# Patient Record
Sex: Female | Born: 1975 | Race: White | Hispanic: No | Marital: Single | Smoking: Former smoker
Health system: Southern US, Community
[De-identification: ages and names within clinical notes are randomized; demographics above are authoritative.]

## PROBLEM LIST (undated history)

## (undated) DIAGNOSIS — Z87442 Personal history of urinary calculi: Secondary | ICD-10-CM

## (undated) DIAGNOSIS — Z87898 Personal history of other specified conditions: Secondary | ICD-10-CM

## (undated) DIAGNOSIS — Z9889 Other specified postprocedural states: Secondary | ICD-10-CM

## (undated) DIAGNOSIS — F419 Anxiety disorder, unspecified: Secondary | ICD-10-CM

## (undated) DIAGNOSIS — I10 Essential (primary) hypertension: Secondary | ICD-10-CM

## (undated) DIAGNOSIS — M792 Neuralgia and neuritis, unspecified: Secondary | ICD-10-CM

## (undated) DIAGNOSIS — T8859XA Other complications of anesthesia, initial encounter: Secondary | ICD-10-CM

## (undated) DIAGNOSIS — R112 Nausea with vomiting, unspecified: Secondary | ICD-10-CM

## (undated) DIAGNOSIS — T4145XA Adverse effect of unspecified anesthetic, initial encounter: Secondary | ICD-10-CM

## (undated) DIAGNOSIS — R82992 Hyperoxaluria: Secondary | ICD-10-CM

## (undated) HISTORY — PX: LITHOTRIPSY: SUR834

## (undated) HISTORY — PX: NEPHRECTOMY TRANSPLANTED ORGAN: SUR880

## (undated) HISTORY — PX: AV FISTULA PLACEMENT: SHX1204

## (undated) HISTORY — PX: LIVER TRANSPLANT: SHX410

## (undated) HISTORY — PX: SUTURE REMOVAL: SHX1060

---

## 2014-06-02 HISTORY — PX: LIVER TRANSPLANT: SHX410

## 2014-06-03 HISTORY — PX: KIDNEY TRANSPLANT: SHX239

## 2014-06-18 HISTORY — PX: OTHER SURGICAL HISTORY: SHX169

## 2015-02-08 ENCOUNTER — Emergency Department (HOSPITAL_COMMUNITY)
Admission: EM | Admit: 2015-02-08 | Discharge: 2015-02-09 | Disposition: A | Discharge status: Home / Self Care | Payer: Medicare Other | Attending: Emergency Medicine | Admitting: Emergency Medicine

## 2015-02-08 ENCOUNTER — Encounter (HOSPITAL_COMMUNITY): Payer: Self-pay | Admitting: Emergency Medicine

## 2015-02-08 ENCOUNTER — Emergency Department (HOSPITAL_COMMUNITY): Payer: Medicare Other

## 2015-02-08 DIAGNOSIS — Y929 Unspecified place or not applicable: Secondary | ICD-10-CM | POA: Insufficient documentation

## 2015-02-08 DIAGNOSIS — S82192A Other fracture of upper end of left tibia, initial encounter for closed fracture: Secondary | ICD-10-CM | POA: Insufficient documentation

## 2015-02-08 DIAGNOSIS — S82142A Displaced bicondylar fracture of left tibia, initial encounter for closed fracture: Secondary | ICD-10-CM

## 2015-02-08 DIAGNOSIS — M25462 Effusion, left knee: Secondary | ICD-10-CM | POA: Insufficient documentation

## 2015-02-08 DIAGNOSIS — W541XXA Struck by dog, initial encounter: Secondary | ICD-10-CM | POA: Insufficient documentation

## 2015-02-08 DIAGNOSIS — Z8639 Personal history of other endocrine, nutritional and metabolic disease: Secondary | ICD-10-CM | POA: Insufficient documentation

## 2015-02-08 DIAGNOSIS — Y939 Activity, unspecified: Secondary | ICD-10-CM | POA: Insufficient documentation

## 2015-02-08 DIAGNOSIS — Y999 Unspecified external cause status: Secondary | ICD-10-CM | POA: Insufficient documentation

## 2015-02-08 DIAGNOSIS — S8992XA Unspecified injury of left lower leg, initial encounter: Secondary | ICD-10-CM | POA: Diagnosis present

## 2015-02-08 HISTORY — DX: Hyperoxaluria: R82.992

## 2015-02-08 MED ORDER — OXYCODONE HCL 5 MG PO TABS: 5.0000 mg | ORAL_TABLET | ORAL | Status: DC | PRN

## 2015-02-08 MED ORDER — MORPHINE SULFATE (PF) 2 MG/ML IV SOLN
2.0000 mg | Freq: Once | INTRAVENOUS | Status: AC
  Administered 2015-02-08: 2 mg via INTRAMUSCULAR
  Filled 2015-02-08: qty 1

## 2015-02-08 NOTE — Discharge Instructions (Signed)
Take oxycodone for severe pain only. No driving or operating heavy machinery while taking oxycodone. This medication may cause drowsiness. Do not bear weight on your leg.  Tibial Plateau Fracture, Undisplaced, Adult You have a fracture (break in bone) of your tibial plateau. This is a fracture in the upper part of the large "shin" bone (tibia) in your lower leg. The plateau is the top of the bone that butts up against the femur (thigh bone of your upper leg). This is what makes up your knee joint. Because this fracture goes into the knee joint, it is necessary that this fracture be fixed in the best position possible. Otherwise over the years this fracture can cause severe arthritis and marked disability. This may still occur even with the best and ideal treatment. These fractures are easily diagnosed with x-rays. TREATMENT  You have a fracture that may heal without disability and can be treated with immobilization. This means the bone can be held with a cast or splint in a favorable position until your caregiver feels it is stable enough (healed well enough) that you can begin range of motion exercises. These will help keep your knee limber (moving well). HOME CARE INSTRUCTIONS   Apply ice to the injury for 15-20 minutes, 03-04 times per day while awake, for 2 days. Put the ice in a plastic bag and place a thin towel between the bag of ice and your cast.  If you have a plaster or fiberglass cast:  Do not try to scratch the skin under the cast using sharp or pointed objects.  Check the skin around the cast every day. You may put lotion on any red or sore areas.  Keep your cast dry and clean.  If you have a plaster splint:  Wear the splint as directed.  You may loosen the elastic around the splint if your toes become numb, tingle, or turn cold or blue.  Do not put pressure on any part of your cast or splint until it is fully hardened.  Your cast or splint can be protected during bathing with  a plastic bag. Do not lower the cast or splint into water.  Use crutches as directed.  Only take over-the-counter or prescription medicines for pain, discomfort, or fever as directed by your caregiver.  See your caregiver as directed. It is very important to keep all follow-up referrals and appointments in order to avoid any long-term problems with your knee including chronic pain, inability to move the ankle normally, and permanent disability. SEEK IMMEDIATE MEDICAL CARE IF:   Pain is becoming worse rather than better, or if pain is uncontrolled with medications.  You have increased swelling or redness in the foot.  You begin to lose feeling in your foot or toes.  You develop a cold or blue foot or toes on the injured side.  You develop severe pain in your injured leg. Document Released: 02/08/2005 Document Revised: 07/24/2011 Document Reviewed: 03/16/2007 [facility] Patient Information 2015 [place], [place]. This information is not intended to replace advice given to you by your health care provider. Make sure you discuss any questions you have with your health care provider.

## 2015-02-08 NOTE — ED Notes (Signed)
The patient said the dogs "clipped" her legs and she fell on her left knee.  The patient rates her pain 9/10.   The patient does have some swelling to the left knee.

## 2015-02-08 NOTE — ED Provider Notes (Signed)
CSN: [id number]     Arrival date & time 02/08/15  1913 History  By signing my name below, I, Soijett Blue, attest that this documentation has been prepared under the direction and in the presence of Celene Skeen, PA-C Electronically Signed: Soijett Blue, ED Scribe. 02/08/2015. 8:09 PM.   Chief Complaint  Patient presents with  . Fall    The patient said the dogs "clipped" her legs and she fell on her left knee.  The patient rates her pain 9/10.      The history is provided by the patient. No language interpreter was used.    Candice Patton is a 39 y.o. female who presents to the Emergency Department complaining of L knee pain onset today. Pt notes that she fell due to her dogs cutting her off and was hit by the dog on the medial aspect of her knee causing her to fall directly onto her knee. She rates her left knee pain as 9/10 at this time. She notes that walking worsens her pain and her left knee pain radiates to her left hip and left foot. She reports that she caught herself before she fell onto the porch. Pt is having associated symptoms of left knee swelling. She denies hitting her head, LOC, HA, color change, wound, and any other symptoms. She notes that she takes 81 mg ASA daily. 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   Past Medical History  Diagnosis Date  . Hyperoxaluria    Past Surgical History  Procedure Laterality Date  . Liver transplant    . Nephrectomy transplanted organ     History reviewed. No pertinent family history. Social History  Substance Use Topics  . Smoking status: Never Smoker   . Smokeless tobacco: Never Used  . Alcohol Use: No   OB History    No data available     Review of Systems  Musculoskeletal: Positive for joint swelling and arthralgias.  Skin: Negative for color change and wound.  Neurological: Negative for syncope and headaches.      Allergies  Review of patient's allergies indicates not on file.  Home Medications    Prior to Admission medications   Medication Sig Start Date End Date Taking? Authorizing Provider  oxyCODONE (ROXICODONE) 5 MG immediate release tablet Take 1 tablet (5 mg total) by mouth every 4 (four) hours as needed for severe pain. 02/08/15   Kamoni Depree M Tyreon Frigon, PA-C   BP 94/55 mmHg  Pulse 73  Temp(Src) 98 F (36.7 C) (Oral)  Resp 16  Ht  (1.499 m)  Wt 90 lb (40.824 kg)  BMI 18.17 kg/m2  SpO2 100%  LMP 01/14/2015 Physical Exam  Constitutional: She is oriented to person, place, and time. She appears well-developed and well-nourished. No distress.  HENT:  Head: Normocephalic and atraumatic.  Mouth/Throat: Oropharynx is clear and moist.  Eyes: Conjunctivae and EOM are normal.  Neck: Normal range of motion. Neck supple.  Cardiovascular: Normal rate, regular rhythm and normal heart sounds.   Pulmonary/Chest: Effort normal and breath sounds normal. No respiratory distress.  Musculoskeletal:  Left knee- moderate swelling both medial and lateral and around patella. No bruising. Tenderness throughout, more so both medial and lateral. Unable to assess ligamentous laxity due to pain. Ankle and hip normal. +2 PT/DP pulse. Sensation intact distally. Compartments soft.  Neurological: She is alert and oriented to person, place, and time. No sensory deficit.  Skin: Skin is warm and dry.  Psychiatric: She has a  normal mood and affect. Her behavior is normal.  Nursing note and vitals reviewed.   ED Course  Procedures (including critical care time) DIAGNOSTIC STUDIES: Oxygen Saturation is 100% on RA, nl by my interpretation.    COORDINATION OF CARE: 8:08 PM Discussed treatment plan with pt at bedside which includes left knee xray and pt agreed to plan.  8:58 PM- Consult with Dr. Ophelia Charter, who recommends CT scan without contrast of left knee. If CT scan returns negative, then give the pt a knee immobilizer, crutches, and follow up with Dr. Ophelia Charter in the office.   Labs Review Labs Reviewed -  No data to display  Imaging Review Ct Knee Left Wo Contrast  02/08/2015   CLINICAL DATA:  Left knee pain and swelling, onset today. Tibia plateau fracture.  EXAM: CT OF THE left KNEE WITHOUT CONTRAST  TECHNIQUE: Multidetector CT imaging of the left knee was performed according to the standard protocol. Multiplanar CT image reconstructions were also generated.  COMPARISON:  Radiography from earlier the same day  FINDINGS: Nondisplaced, non-depressed lateral tibial plateau fracture with 2 discrete fracture planes extending sagittally across the articular surface of the lateral plateau. No additional fracture seen.  Large joint effusion with fat, fluid, and hematocrit levels.  Bone island in the distal femur.  Grossly intact ligamentous and tendinous structures about the knee. Normal knee alignment.  IMPRESSION: Non-displaced, non-depressed lateral tibial plateau fracture.   Electronically Signed   By: Marnee Spring M.D.   On: 02/08/2015 23:34   Dg Knee Complete 4 Views Left  02/08/2015   CLINICAL DATA:  LEFT knee pain. Fall. LEFT KNEE PAIN FALL TODAY OUTSIDE WHEN HIT IN LEG BY NEIGHBORS DOG  EXAM: LEFT KNEE - COMPLETE 4+ VIEW  COMPARISON:  None.  FINDINGS: There is a lateral tibial plateau fracture without depression. The fracture is just lateral to the tibial eminence. Large lipohemarthrosis is present. Sclerotic lesion is present in the distal femoral metaphysis, compatible with a bone island. The alignment of the LEFT knee is anatomic. Patella appears normal.  IMPRESSION: Nondisplaced/ nondepressed intra-articular lateral tibial plateau fracture.   Electronically Signed   By: Andreas Newport M.D.   On: 02/08/2015 20:36      I have personally reviewed and evaluated these images and lab results as part of my medical decision-making.   EKG Interpretation None      MDM   Final diagnoses:  Fracture, tibial plateau, left, closed, initial encounter  Knee effusion, left   Neurovascularly intact  distally. Compartments soft. I spoke with Dr. Ophelia Charter with orthopedics who recommends CT, knee immobilizer and f/u in the office tomorrow. Stable for d/c. Return precautions given. Patient states understanding of treatment care plan and is agreeable.  Discussed with attending Dr. Adriana Simas who also evaluated patient and agrees with plan of care.  I personally performed the services described in this documentation, which was scribed in my presence. The recorded information has been reviewed and is accurate.  Kathrynn Speed, PA-C 02/09/15 1658  Donnetta Hutching, MD 02/09/15 [phone]

## 2015-02-09 NOTE — ED Notes (Signed)
Patient left at this time with all belongings. 

## 2015-02-11 ENCOUNTER — Other Ambulatory Visit (HOSPITAL_COMMUNITY): Payer: Self-pay | Admitting: Orthopaedic Surgery

## 2015-02-12 ENCOUNTER — Encounter (HOSPITAL_COMMUNITY): Payer: Self-pay | Admitting: *Deleted

## 2015-02-12 NOTE — Progress Notes (Signed)
Anesthesia Chart Review: SAME DAY WORK-UP.  Patient is a 39 year old female scheduled for ORIF left lateral tibial plateau fracture on 02/15/15 by Dr. Ophelia Charter.   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 Post-operative course complicated by concern for compartment syndrome of liver, HAT although doppler signals of the hepatic artery and portal vein were noted to be good at the time. Abdomen was closed with use of strattice mesh. Graft function further remained stable and improved therefore she was returned to the OR 06/18/2014 for removal of strattice mesh and primary closure of chevron incision. With continued improvement of graft function, and patients overall stability, she was discharged home locally 06/17/2014. She underwent suture removal under anesthesia 01/13/15 that were causing her pain and underwent drainage of left aspect of incision 01/25/15 and dressing changes initiated.  She was seen in the [facility] on 02/11/15 (see Care Everywhere) and no contraindication for planned procedure from their standpoint.   Medication list is not yet completed. Transplant clinic notes list: ASA, Vitamin D3, Mycelex, Pepcid, Neurontin, Anusol-HC, Mag-ox, Cellcept, oxycodone, Urocit-K, prednisone, Bactrim DS, Prograf.   10/16/14 EKG interpretation ([facility], Care Everywhere): NSR, LAE. [facility] will not send tracing without a release.   02/27/14 Stress Echo (Care Everywhere): INTERPRETATION --------------------------------------------------------------- NORMAL STRESS TEST. NO VALVULAR REGURGITATION NO VALVULAR STENOSIS Note: NEGATIVE SALINE CONTRAST STUDY  02/12/14 Echo (Care Everywhere): INTERPRETATION --------------------------------------------------------------- NORMAL LEFT VENTRICULAR SYSTOLIC FUNCTION NORMAL RIGHT VENTRICULAR SYSTOLIC FUNCTION VALVULAR REGURGITATION: TRIVIAL MR,  TRIVIAL TR NO VALVULAR STENOSIS UNABLE TO GAIN IV ACCESS FOR MICROCAVITATION STUDY NO PRIOR STUDY FOR COMPARISON  02/11/14 PFTs (Care Everywhere): FVC 2.71 (92%), FEV1 1.98 (77%), DLCO 12.0 (60%).  She has labs from 02/11/15 in Care Everywhere showing a normal CMET other than a low albumin of 3.2. Her CBC is from 02/04/15 and was WNL. Any additional labs will be drawn on arrival.  If her AVF is still functioning then she will need a restricted arm band.  Velna Ochs [facility] Short Stay Center/Anesthesiology Phone [phone] 02/12/2015 10:47 AM

## 2015-02-14 MED ORDER — CEFAZOLIN SODIUM 1-5 GM-% IV SOLN
1.0000 g | INTRAVENOUS | Status: AC
  Administered 2015-02-15: 1 g via INTRAVENOUS
  Filled 2015-02-14: qty 50

## 2015-02-15 ENCOUNTER — Encounter (HOSPITAL_COMMUNITY)
Admission: RE | Disposition: A | Discharge status: Home / Self Care | Payer: Self-pay | Source: Ambulatory Visit | Attending: Orthopaedic Surgery

## 2015-02-15 ENCOUNTER — Ambulatory Visit (HOSPITAL_COMMUNITY): Payer: Medicare Other

## 2015-02-15 ENCOUNTER — Observation Stay (HOSPITAL_COMMUNITY)
Admission: RE | Admit: 2015-02-15 | Discharge: 2015-02-16 | Disposition: A | Discharge status: Home / Self Care | Payer: Medicare Other | Source: Ambulatory Visit | Attending: Orthopaedic Surgery | Admitting: Orthopaedic Surgery

## 2015-02-15 ENCOUNTER — Ambulatory Visit (HOSPITAL_COMMUNITY): Payer: Medicare Other | Admitting: Vascular Surgery

## 2015-02-15 ENCOUNTER — Encounter (HOSPITAL_COMMUNITY): Payer: Self-pay | Admitting: *Deleted

## 2015-02-15 DIAGNOSIS — Z87891 Personal history of nicotine dependence: Secondary | ICD-10-CM | POA: Diagnosis not present

## 2015-02-15 DIAGNOSIS — W19XXXA Unspecified fall, initial encounter: Secondary | ICD-10-CM | POA: Diagnosis not present

## 2015-02-15 DIAGNOSIS — Z7982 Long term (current) use of aspirin: Secondary | ICD-10-CM | POA: Diagnosis not present

## 2015-02-15 DIAGNOSIS — Z87442 Personal history of urinary calculi: Secondary | ICD-10-CM | POA: Diagnosis not present

## 2015-02-15 DIAGNOSIS — Z885 Allergy status to narcotic agent status: Secondary | ICD-10-CM | POA: Diagnosis not present

## 2015-02-15 DIAGNOSIS — Y9289 Other specified places as the place of occurrence of the external cause: Secondary | ICD-10-CM | POA: Diagnosis not present

## 2015-02-15 DIAGNOSIS — Z940 Kidney transplant status: Secondary | ICD-10-CM | POA: Diagnosis not present

## 2015-02-15 DIAGNOSIS — S82142A Displaced bicondylar fracture of left tibia, initial encounter for closed fracture: Principal | ICD-10-CM | POA: Insufficient documentation

## 2015-02-15 DIAGNOSIS — I120 Hypertensive chronic kidney disease with stage 5 chronic kidney disease or end stage renal disease: Secondary | ICD-10-CM | POA: Insufficient documentation

## 2015-02-15 DIAGNOSIS — Y998 Other external cause status: Secondary | ICD-10-CM | POA: Insufficient documentation

## 2015-02-15 DIAGNOSIS — N186 End stage renal disease: Secondary | ICD-10-CM | POA: Diagnosis not present

## 2015-02-15 DIAGNOSIS — Z992 Dependence on renal dialysis: Secondary | ICD-10-CM | POA: Insufficient documentation

## 2015-02-15 DIAGNOSIS — R262 Difficulty in walking, not elsewhere classified: Secondary | ICD-10-CM | POA: Insufficient documentation

## 2015-02-15 DIAGNOSIS — Y9389 Activity, other specified: Secondary | ICD-10-CM | POA: Diagnosis not present

## 2015-02-15 DIAGNOSIS — S82143A Displaced bicondylar fracture of unspecified tibia, initial encounter for closed fracture: Secondary | ICD-10-CM | POA: Diagnosis present

## 2015-02-15 DIAGNOSIS — Z419 Encounter for procedure for purposes other than remedying health state, unspecified: Secondary | ICD-10-CM

## 2015-02-15 HISTORY — DX: Other complications of anesthesia, initial encounter: T88.59XA

## 2015-02-15 HISTORY — DX: Other specified postprocedural states: Z98.890

## 2015-02-15 HISTORY — PX: ORIF TIBIA PLATEAU: SHX2132

## 2015-02-15 HISTORY — DX: Nausea with vomiting, unspecified: R11.2

## 2015-02-15 HISTORY — DX: Essential (primary) hypertension: I10

## 2015-02-15 HISTORY — DX: Anxiety disorder, unspecified: F41.9

## 2015-02-15 HISTORY — DX: Adverse effect of unspecified anesthetic, initial encounter: T41.45XA

## 2015-02-15 HISTORY — DX: Neuralgia and neuritis, unspecified: M79.2

## 2015-02-15 HISTORY — DX: Personal history of urinary calculi: Z87.442

## 2015-02-15 HISTORY — DX: Personal history of other specified conditions: Z87.898

## 2015-02-15 LAB — COMPREHENSIVE METABOLIC PANEL
ALBUMIN: 3.1 g/dL — AB (ref 3.5–5.0)
ALT: 9 U/L — AB (ref 14–54)
AST: 19 U/L (ref 15–41)
Alkaline Phosphatase: 63 U/L (ref 38–126)
Anion gap: 7 (ref 5–15)
BILIRUBIN TOTAL: 0.3 mg/dL (ref 0.3–1.2)
BUN: 13 mg/dL (ref 6–20)
CO2: 22 mmol/L (ref 22–32)
CREATININE: 0.88 mg/dL (ref 0.44–1.00)
Calcium: 9.3 mg/dL (ref 8.9–10.3)
Chloride: 107 mmol/L (ref 101–111)
GFR calc Af Amer: >60 mL/min (ref >60)
GLUCOSE: 96 mg/dL (ref 65–99)
Potassium: 4.6 mmol/L (ref 3.5–5.1)
Sodium: 136 mmol/L (ref 135–145)
TOTAL PROTEIN: 5.3 g/dL — AB (ref 6.5–8.1)

## 2015-02-15 LAB — CBC
HEMATOCRIT: 36.6 % (ref 36.0–46.0)
HEMOGLOBIN: 11.7 g/dL — AB (ref 12.0–15.0)
MCH: 28.9 pg (ref 26.0–34.0)
MCHC: 32 g/dL (ref 30.0–36.0)
MCV: 90.4 fL (ref 78.0–100.0)
Platelets: 213 10*3/uL (ref 150–400)
RBC: 4.05 MIL/uL (ref 3.87–5.11)
RDW: 12.8 % (ref 11.5–15.5)
WBC: 8.6 10*3/uL (ref 4.0–10.5)

## 2015-02-15 LAB — PROTIME-INR
INR: 1.01 (ref 0.00–1.49)
PROTHROMBIN TIME: 13.5 s (ref 11.6–15.2)

## 2015-02-15 SURGERY — OPEN REDUCTION INTERNAL FIXATION (ORIF) TIBIAL PLATEAU
Anesthesia: Regional | Site: Leg Lower | Laterality: Left

## 2015-02-15 MED ORDER — MAGNESIUM OXIDE 400 MG PO TABS: 400.0000 mg | ORAL_TABLET | Freq: Two times a day (BID) | ORAL | Status: DC

## 2015-02-15 MED ORDER — HYDROMORPHONE HCL 1 MG/ML IJ SOLN
INTRAMUSCULAR | Status: AC
  Filled 2015-02-15: qty 1

## 2015-02-15 MED ORDER — DOCUSATE SODIUM 100 MG PO CAPS
100.0000 mg | ORAL_CAPSULE | Freq: Two times a day (BID) | ORAL | Status: DC
  Administered 2015-02-15: 100 mg via ORAL
  Filled 2015-02-15: qty 1

## 2015-02-15 MED ORDER — ASPIRIN EC 81 MG PO TBEC: 81.0000 mg | DELAYED_RELEASE_TABLET | Freq: Every day | ORAL | Status: DC

## 2015-02-15 MED ORDER — FENTANYL CITRATE (PF) 250 MCG/5ML IJ SOLN
INTRAMUSCULAR | Status: AC
  Filled 2015-02-15: qty 5

## 2015-02-15 MED ORDER — MIDAZOLAM HCL 2 MG/2ML IJ SOLN
INTRAMUSCULAR | Status: AC
  Filled 2015-02-15: qty 4

## 2015-02-15 MED ORDER — FENTANYL CITRATE (PF) 100 MCG/2ML IJ SOLN
INTRAMUSCULAR | Status: AC
  Filled 2015-02-15: qty 2

## 2015-02-15 MED ORDER — BUPIVACAINE HCL (PF) 0.25 % IJ SOLN
INTRAMUSCULAR | Status: AC
  Filled 2015-02-15: qty 30

## 2015-02-15 MED ORDER — PROPOFOL 10 MG/ML IV BOLUS
INTRAVENOUS | Status: AC
  Filled 2015-02-15: qty 20

## 2015-02-15 MED ORDER — OXYCODONE HCL 5 MG PO TABS
5.0000 mg | ORAL_TABLET | ORAL | Status: DC | PRN
  Administered 2015-02-15 – 2015-02-16 (×5): 10 mg via ORAL
  Filled 2015-02-15 (×4): qty 2

## 2015-02-15 MED ORDER — CLOTRIMAZOLE 10 MG MT TROC
10.0000 mg | Freq: Two times a day (BID) | OROMUCOSAL | Status: DC
  Administered 2015-02-15: 10 mg via ORAL
  Filled 2015-02-15 (×3): qty 1

## 2015-02-15 MED ORDER — TACROLIMUS 1 MG PO CAPS
1.0000 mg | ORAL_CAPSULE | Freq: Two times a day (BID) | ORAL | Status: DC
  Administered 2015-02-15: 1 mg via ORAL
  Filled 2015-02-15 (×3): qty 1

## 2015-02-15 MED ORDER — ONDANSETRON HCL 4 MG/2ML IJ SOLN
INTRAMUSCULAR | Status: DC | PRN
  Administered 2015-02-15: 4 mg via INTRAVENOUS

## 2015-02-15 MED ORDER — ROCURONIUM BROMIDE 50 MG/5ML IV SOLN
INTRAVENOUS | Status: AC
  Filled 2015-02-15: qty 1

## 2015-02-15 MED ORDER — FENTANYL CITRATE (PF) 100 MCG/2ML IJ SOLN
100.0000 ug | Freq: Once | INTRAMUSCULAR | Status: AC
  Administered 2015-02-15: 100 ug via INTRAVENOUS
  Filled 2015-02-15: qty 2

## 2015-02-15 MED ORDER — FENTANYL CITRATE (PF) 100 MCG/2ML IJ SOLN
INTRAMUSCULAR | Status: DC | PRN
  Administered 2015-02-15 (×4): 25 ug via INTRAVENOUS
  Administered 2015-02-15: 50 ug via INTRAVENOUS
  Administered 2015-02-15 (×4): 25 ug via INTRAVENOUS

## 2015-02-15 MED ORDER — POTASSIUM CHLORIDE IN NACL 20-0.45 MEQ/L-% IV SOLN
INTRAVENOUS | Status: DC
  Administered 2015-02-15: 21:00:00 via INTRAVENOUS
  Filled 2015-02-15 (×2): qty 1000

## 2015-02-15 MED ORDER — METOCLOPRAMIDE HCL 5 MG PO TABS: 5.0000 mg | ORAL_TABLET | Freq: Three times a day (TID) | ORAL | Status: DC | PRN

## 2015-02-15 MED ORDER — HYDROMORPHONE HCL 1 MG/ML IJ SOLN
0.5000 mg | INTRAMUSCULAR | Status: DC | PRN
  Administered 2015-02-15: 0.5 mg via INTRAVENOUS

## 2015-02-15 MED ORDER — MIDAZOLAM HCL 2 MG/2ML IJ SOLN
INTRAMUSCULAR | Status: AC
  Filled 2015-02-15: qty 2

## 2015-02-15 MED ORDER — ONDANSETRON HCL 4 MG PO TABS: 4.0000 mg | ORAL_TABLET | Freq: Four times a day (QID) | ORAL | Status: DC | PRN

## 2015-02-15 MED ORDER — ONDANSETRON HCL 4 MG/2ML IJ SOLN: 4.0000 mg | Freq: Four times a day (QID) | INTRAMUSCULAR | Status: DC | PRN

## 2015-02-15 MED ORDER — OXYCODONE HCL 5 MG PO TABS: 5.0000 mg | ORAL_TABLET | ORAL | Status: AC | PRN

## 2015-02-15 MED ORDER — FENTANYL CITRATE (PF) 100 MCG/2ML IJ SOLN
50.0000 ug | Freq: Once | INTRAMUSCULAR | Status: DC
  Filled 2015-02-15: qty 1

## 2015-02-15 MED ORDER — DEXAMETHASONE SODIUM PHOSPHATE 10 MG/ML IJ SOLN
INTRAMUSCULAR | Status: DC | PRN
  Administered 2015-02-15: 4 mg via INTRAVENOUS

## 2015-02-15 MED ORDER — LIDOCAINE HCL (CARDIAC) 20 MG/ML IV SOLN
INTRAVENOUS | Status: AC
  Filled 2015-02-15: qty 5

## 2015-02-15 MED ORDER — MAGNESIUM OXIDE 400 (241.3 MG) MG PO TABS
400.0000 mg | ORAL_TABLET | Freq: Two times a day (BID) | ORAL | Status: DC
  Filled 2015-02-15: qty 1

## 2015-02-15 MED ORDER — CHLORHEXIDINE GLUCONATE 4 % EX LIQD: 60.0000 mL | Freq: Once | CUTANEOUS | Status: DC

## 2015-02-15 MED ORDER — PROPOFOL 10 MG/ML IV BOLUS
INTRAVENOUS | Status: DC | PRN
  Administered 2015-02-15: 150 mg via INTRAVENOUS

## 2015-02-15 MED ORDER — LIDOCAINE HCL (CARDIAC) 20 MG/ML IV SOLN
INTRAVENOUS | Status: DC | PRN
  Administered 2015-02-15: 60 mg via INTRAVENOUS

## 2015-02-15 MED ORDER — PROMETHAZINE HCL 25 MG PO TABS: 25.0000 mg | ORAL_TABLET | Freq: Four times a day (QID) | ORAL | Status: DC | PRN

## 2015-02-15 MED ORDER — PREDNISONE 5 MG PO TABS: 5.0000 mg | ORAL_TABLET | Freq: Every day | ORAL | Status: DC

## 2015-02-15 MED ORDER — SODIUM CHLORIDE 0.9 % IV SOLN
INTRAVENOUS | Status: DC
  Administered 2015-02-15: 10 mL/h via INTRAVENOUS
  Administered 2015-02-15: 16:00:00 via INTRAVENOUS

## 2015-02-15 MED ORDER — HYDROCORTISONE 2.5 % RE CREA: 1.0000 "application " | TOPICAL_CREAM | Freq: Two times a day (BID) | RECTAL | Status: DC | PRN

## 2015-02-15 MED ORDER — FAMOTIDINE 20 MG PO TABS: 10.0000 mg | ORAL_TABLET | Freq: Every day | ORAL | Status: DC | PRN

## 2015-02-15 MED ORDER — PROMETHAZINE HCL 25 MG/ML IJ SOLN
INTRAMUSCULAR | Status: AC
  Filled 2015-02-15: qty 1

## 2015-02-15 MED ORDER — PROMETHAZINE HCL 25 MG/ML IJ SOLN
6.2500 mg | INTRAMUSCULAR | Status: DC | PRN
  Administered 2015-02-15: 6.25 mg via INTRAVENOUS

## 2015-02-15 MED ORDER — HYDROMORPHONE HCL 1 MG/ML IJ SOLN
0.2500 mg | INTRAMUSCULAR | Status: DC | PRN
  Administered 2015-02-15 (×2): 0.5 mg via INTRAVENOUS
  Administered 2015-02-15: 1 mg via INTRAVENOUS

## 2015-02-15 MED ORDER — VITAMIN D 1000 UNITS PO TABS: 5000.0000 [IU] | ORAL_TABLET | Freq: Every day | ORAL | Status: DC

## 2015-02-15 MED ORDER — GABAPENTIN 400 MG PO CAPS: 400.0000 mg | ORAL_CAPSULE | Freq: Two times a day (BID) | ORAL | Status: DC | PRN

## 2015-02-15 MED ORDER — METOCLOPRAMIDE HCL 5 MG/ML IJ SOLN: 5.0000 mg | Freq: Three times a day (TID) | INTRAMUSCULAR | Status: DC | PRN

## 2015-02-15 MED ORDER — 0.9 % SODIUM CHLORIDE (POUR BTL) OPTIME
TOPICAL | Status: DC | PRN
  Administered 2015-02-15: 1000 mL

## 2015-02-15 MED ORDER — OXYCODONE HCL 5 MG PO TABS
ORAL_TABLET | ORAL | Status: AC
  Filled 2015-02-15: qty 2

## 2015-02-15 MED ORDER — BUPIVACAINE HCL (PF) 0.25 % IJ SOLN
INTRAMUSCULAR | Status: DC | PRN
  Administered 2015-02-15: 10 mL

## 2015-02-15 MED ORDER — MEPERIDINE HCL 25 MG/ML IJ SOLN: 6.2500 mg | INTRAMUSCULAR | Status: DC | PRN

## 2015-02-15 MED ORDER — ASPIRIN 81 MG PO TABS: 81.0000 mg | ORAL_TABLET | Freq: Every day | ORAL | Status: DC

## 2015-02-15 MED ORDER — MIDAZOLAM HCL 5 MG/5ML IJ SOLN
INTRAMUSCULAR | Status: DC | PRN
  Administered 2015-02-15: 1 mg via INTRAVENOUS

## 2015-02-15 MED ORDER — MYCOPHENOLATE MOFETIL 250 MG PO CAPS
500.0000 mg | ORAL_CAPSULE | Freq: Two times a day (BID) | ORAL | Status: DC
  Administered 2015-02-15: 500 mg via ORAL
  Filled 2015-02-15 (×3): qty 2

## 2015-02-15 SURGICAL SUPPLY — 65 items
3.2MM GUIDE PIN X 9" ×3 IMPLANT
BANDAGE ELASTIC 4 VELCRO ST LF (GAUZE/BANDAGES/DRESSINGS) IMPLANT
BANDAGE ELASTIC 6 VELCRO ST LF (GAUZE/BANDAGES/DRESSINGS) ×3 IMPLANT
BIT DRILL CANNULATED (DRILL) ×1 IMPLANT
BLADE SURG 10 STRL SS (BLADE) ×3 IMPLANT
BLADE SURG ROTATE 9660 (MISCELLANEOUS) IMPLANT
BNDG GAUZE ELAST 4 BULKY (GAUZE/BANDAGES/DRESSINGS) IMPLANT
CLEANER TIP ELECTROSURG 2X2 (MISCELLANEOUS) IMPLANT
COVER MAYO STAND STRL (DRAPES) IMPLANT
COVER SURGICAL LIGHT HANDLE (MISCELLANEOUS) ×3 IMPLANT
CUFF TOURNIQUET SINGLE 34IN LL (TOURNIQUET CUFF) IMPLANT
CUFF TOURNIQUET SINGLE 44IN (TOURNIQUET CUFF) IMPLANT
DRAPE C-ARM 42X72 X-RAY (DRAPES) ×3 IMPLANT
DRAPE INCISE IOBAN 66X45 STRL (DRAPES) IMPLANT
DRAPE U-SHAPE 47X51 STRL (DRAPES) IMPLANT
DRILL CANNULATED (DRILL) ×3
DRSG ADAPTIC 3X8 NADH LF (GAUZE/BANDAGES/DRESSINGS) IMPLANT
DRSG PAD ABDOMINAL 8X10 ST (GAUZE/BANDAGES/DRESSINGS) IMPLANT
DURAPREP 26ML APPLICATOR (WOUND CARE) ×3 IMPLANT
ELECT REM PT RETURN 9FT ADLT (ELECTROSURGICAL) ×3
ELECTRODE REM PT RTRN 9FT ADLT (ELECTROSURGICAL) ×1 IMPLANT
EVACUATOR 1/8 PVC DRAIN (DRAIN) IMPLANT
GAUZE SPONGE 4X4 12PLY STRL (GAUZE/BANDAGES/DRESSINGS) ×3 IMPLANT
GAUZE XEROFORM 1X8 LF (GAUZE/BANDAGES/DRESSINGS) ×3 IMPLANT
GLOVE BIOGEL PI IND STRL 7.5 (GLOVE) ×1 IMPLANT
GLOVE BIOGEL PI IND STRL 8 (GLOVE) ×1 IMPLANT
GLOVE BIOGEL PI INDICATOR 7.5 (GLOVE) ×2
GLOVE BIOGEL PI INDICATOR 8 (GLOVE) ×2
GLOVE ECLIPSE 7.0 STRL STRAW (GLOVE) ×3 IMPLANT
GLOVE ORTHO TXT STRL SZ7.5 (GLOVE) ×3 IMPLANT
GOWN STRL REUS W/ TWL LRG LVL3 (GOWN DISPOSABLE) ×2 IMPLANT
GOWN STRL REUS W/ TWL XL LVL3 (GOWN DISPOSABLE) ×1 IMPLANT
GOWN STRL REUS W/TWL LRG LVL3 (GOWN DISPOSABLE) ×4
GOWN STRL REUS W/TWL XL LVL3 (GOWN DISPOSABLE) ×2
KIT BASIN OR (CUSTOM PROCEDURE TRAY) ×3 IMPLANT
KIT ROOM TURNOVER OR (KITS) ×3 IMPLANT
MANIFOLD NEPTUNE II (INSTRUMENTS) IMPLANT
NEEDLE HYPO 25X1 1.5 SAFETY (NEEDLE) IMPLANT
NS IRRIG 1000ML POUR BTL (IV SOLUTION) ×3 IMPLANT
PACK ORTHO EXTREMITY (CUSTOM PROCEDURE TRAY) ×3 IMPLANT
PAD ARMBOARD 7.5X6 YLW CONV (MISCELLANEOUS) ×6 IMPLANT
PAD CAST 4YDX4 CTTN HI CHSV (CAST SUPPLIES) IMPLANT
PADDING CAST COTTON 4X4 STRL (CAST SUPPLIES)
PADDING CAST COTTON 6X4 STRL (CAST SUPPLIES) ×3 IMPLANT
SCREW CANNULATED 6.5X55MM (Screw) ×3 IMPLANT
SPONGE LAP 18X18 X RAY DECT (DISPOSABLE) ×3 IMPLANT
STAPLER VISISTAT 35W (STAPLE) IMPLANT
STOCKINETTE IMPERVIOUS LG (DRAPES) ×3 IMPLANT
SUCTION FRAZIER TIP 10 FR DISP (SUCTIONS) IMPLANT
SUT ETHIBOND 2 0 V5 (SUTURE) ×3 IMPLANT
SUT ETHILON 3 0 PS 1 (SUTURE) ×3 IMPLANT
SUT VIC AB 0 CT1 27 (SUTURE) ×2
SUT VIC AB 0 CT1 27XBRD ANBCTR (SUTURE) ×1 IMPLANT
SUT VIC AB 1 CT1 27 (SUTURE) ×2
SUT VIC AB 1 CT1 27XBRD ANBCTR (SUTURE) ×1 IMPLANT
SUT VIC AB 2-0 CT1 27 (SUTURE)
SUT VIC AB 2-0 CT1 TAPERPNT 27 (SUTURE) IMPLANT
SYR CONTROL 10ML LL (SYRINGE) IMPLANT
TOWEL OR 17X24 6PK STRL BLUE (TOWEL DISPOSABLE) ×3 IMPLANT
TOWEL OR 17X26 10 PK STRL BLUE (TOWEL DISPOSABLE) ×3 IMPLANT
TUBE CONNECTING 12'X1/4 (SUCTIONS)
TUBE CONNECTING 12X1/4 (SUCTIONS) IMPLANT
WASHER 5.5MM STAINLESS STEEL (Washer) ×3 IMPLANT
WATER STERILE IRR 1000ML POUR (IV SOLUTION) IMPLANT
YANKAUER SUCT BULB TIP NO VENT (SUCTIONS) IMPLANT

## 2015-02-15 NOTE — Interval H&P Note (Signed)
History and Physical Interval Note:  02/15/2015 2:13 PM  Candice Patton  has presented today for surgery, with the diagnosis of Left Lateral Tibial Plateau Fracture  The various methods of treatment have been discussed with the patient and family. After consideration of risks, benefits and other options for treatment, the patient has consented to  Procedure(s): OPEN REDUCTION INTERNAL FIXATION (ORIF) LEFT LATERAL TIBIAL PLATEAU (Left) as a surgical intervention .  The patient's history has been reviewed, patient examined, no change in status, stable for surgery.  I have reviewed the patient's chart and labs.  Questions were answered to the patient's satisfaction.     [name]

## 2015-02-15 NOTE — Anesthesia Postprocedure Evaluation (Signed)
  Anesthesia Post-op Note  Patient: Candice Patton  Procedure(s) Performed: Procedure(s): OPEN REDUCTION INTERNAL FIXATION (ORIF) LEFT LATERAL TIBIAL PLATEAU (Left)  Patient Location: PACU  Anesthesia Type:General  Level of Consciousness: awake, alert  and oriented  Airway and Oxygen Therapy: Patient Spontanous Breathing  Post-op Pain: mild  Post-op Assessment: Post-op Vital signs reviewed and Patient's Cardiovascular Status Stable LLE Motor Response: Purposeful movement LLE Sensation: Full sensation          Post-op Vital Signs: Reviewed and stable  Last Vitals:  Filed Vitals:   02/15/15 1700  BP: 125/68  Pulse: 87  Temp:   Resp: 15    Complications: No apparent anesthesia complications

## 2015-02-15 NOTE — Anesthesia Procedure Notes (Signed)
Procedure Name: LMA Insertion Date/Time: 02/15/2015 3:36 PM Performed by: Glo Herring B Pre-anesthesia Checklist: Patient identified, Emergency Drugs available, Suction available, Patient being monitored and Timeout performed Patient Re-evaluated:Patient Re-evaluated prior to inductionOxygen Delivery Method: Circle system utilized Preoxygenation: Pre-oxygenation with 100% oxygen Intubation Type: IV induction LMA: LMA inserted LMA Size: 3.0 Number of attempts: 1 Placement Confirmation: positive ETCO2,  CO2 detector and breath sounds checked- equal and bilateral Tube secured with: Tape Dental Injury: Teeth and Oropharynx as per pre-operative assessment

## 2015-02-15 NOTE — Anesthesia Preprocedure Evaluation (Addendum)
Anesthesia Evaluation  Patient identified by MRN, date of birth, ID band Patient awake    Reviewed: Allergy & Precautions, NPO status , Patient's Chart, lab work & pertinent test results  History of Anesthesia Complications (+) PONV and history of anesthetic complications  Airway Mallampati: II  TM Distance: >3 FB Neck ROM: Full    Dental no notable dental hx.    Pulmonary former smoker,    Pulmonary exam normal breath sounds clear to auscultation       Cardiovascular hypertension, Pt. on medications Normal cardiovascular exam Rhythm:Regular Rate:Normal     Neuro/Psych PSYCHIATRIC DISORDERS Anxiety  Neuromuscular disease    GI/Hepatic negative GI ROS, Neg liver ROS,   Endo/Other  negative endocrine ROS  Renal/GU Renal disease     Musculoskeletal negative musculoskeletal ROS (+)   Abdominal   Peds  Hematology negative hematology ROS (+)   Anesthesia Other Findings   Reproductive/Obstetrics negative OB ROS                            Anesthesia Physical Anesthesia Plan  ASA: II  Anesthesia Plan: General and Regional   Post-op Pain Management:    Induction: Intravenous  Airway Management Planned: Oral ETT  Additional Equipment:   Intra-op Plan:   Post-operative Plan: Extubation in OR  Informed Consent: I have reviewed the patients History and Physical, chart, labs and discussed the procedure including the risks, benefits and alternatives for the proposed anesthesia with the patient or authorized representative who has indicated his/her understanding and acceptance.   Dental advisory given  Plan Discussed with: CRNA  Anesthesia Plan Comments:         Anesthesia Quick Evaluation

## 2015-02-15 NOTE — H&P (Signed)
Candice Patton is an 39 y.o. female.   Chief Complaint: left lateral tibial plateau fracture HPI: patient suffered a fall.  Unable to ambulated after incident.  Xray's and CT scan showed a lateral tibial plateau fracture.  No other injuries.    Past Medical History  Diagnosis Date  . Hyperoxaluria       ESRD- KIdney Transplant 06/03/14  . Hyperoxaluria   . Hyperoxaluria   . Complication of anesthesia   . PONV (postoperative nausea and vomiting)   . Hypertension     dialysis  . Anxiety     hx of- not current  . History of kidney stones   . Hx of blood transfusion reaction   . Nerve pain     post liver transplant. " had to strech me, because I am so small"- on gabapentin    Past Surgical History  Procedure Laterality Date  . Nephrectomy transplanted organ    . Kidney transplant  06/03/14  . Liver transplant  06/02/14  . Av fistula placement Right      x2  . Removal of stratice and primary closure  06/18/14  . Suture removal    . Liver transplant    . Lithotripsy      under anesthesia- numerous    History reviewed. No pertinent family history. Social History:  reports that she has quit smoking. She has never used smokeless tobacco. She reports that she does not drink alcohol or use illicit drugs.  Allergies:  Allergies  Allergen Reactions  . Oxycodone-Acetaminophen Itching and Nausea Only    Constipation per patient  She tolerates plain oxycodone    No prescriptions prior to admission    No results found for this or any previous visit (from the past 48 hour(s)). No results found.  Review of Systems  Constitutional: Negative.   HENT: Negative.   Respiratory: Negative.   Cardiovascular: Negative.   Musculoskeletal: Positive for joint pain and falls.  Skin: Negative.   Neurological: Negative.   Psychiatric/Behavioral: Negative.     Last menstrual period 02/09/2015. Physical Exam  Constitutional: She is oriented to person, place, and time. No distress.  HENT:   Head: Atraumatic.  Eyes: EOM are normal.  Neck: Normal range of motion.  Respiratory: No respiratory distress.  GI: She exhibits no distension.  Musculoskeletal: She exhibits tenderness.  Proximal tibial tender to palpation. Some swelling.  NVI.    Neurological: She is alert and oriented to person, place, and time.  Skin: Skin is warm and dry.     Assessment/Plan Left lateral tibial plateau fracture.   Will proceed with ORIF.  Surgical procedure along with possible rehab/recovery time discussed.  Also went over possible risks and complication.  All questions answered.    Candice Patton M 02/15/2015, 12:06 PM

## 2015-02-15 NOTE — Transfer of Care (Signed)
Immediate Anesthesia Transfer of Care Note  Patient: Candice Patton  Procedure(s) Performed: Procedure(s): OPEN REDUCTION INTERNAL FIXATION (ORIF) LEFT LATERAL TIBIAL PLATEAU (Left)  Patient Location: PACU  Anesthesia Type:General  Level of Consciousness: awake, alert  and oriented  Airway & Oxygen Therapy: Patient Spontanous Breathing and Patient connected to nasal cannula oxygen  Post-op Assessment: Report given to RN, Post -op Vital signs reviewed and stable and Patient moving all extremities X 4  Post vital signs: Reviewed and stable  Last Vitals:  Filed Vitals:   02/15/15 1238  BP: 91/45  Pulse: 83  Temp: 37.1 C  Resp: 18   HR 98, RR 16, Sats 100%, BP 1274/69 Complications: No apparent anesthesia complications

## 2015-02-15 NOTE — Interval H&P Note (Signed)
History and Physical Interval Note:  02/15/2015 3:15 PM  Candice Patton  has presented today for surgery, with the diagnosis of Left Lateral Tibial Plateau Fracture  The various methods of treatment have been discussed with the patient and family. After consideration of risks, benefits and other options for treatment, the patient has consented to  Procedure(s): OPEN REDUCTION INTERNAL FIXATION (ORIF) LEFT LATERAL TIBIAL PLATEAU (Left) as a surgical intervention .  The patient's history has been reviewed, patient examined, no change in status, stable for surgery.  I have reviewed the patient's chart and labs.  Questions were answered to the patient's satisfaction.     YATES,MARK C

## 2015-02-16 ENCOUNTER — Encounter (HOSPITAL_COMMUNITY): Payer: Self-pay | Admitting: Orthopaedic Surgery

## 2015-02-16 DIAGNOSIS — S82142A Displaced bicondylar fracture of left tibia, initial encounter for closed fracture: Secondary | ICD-10-CM | POA: Diagnosis not present

## 2015-02-16 LAB — BASIC METABOLIC PANEL
Anion gap: 5 (ref 5–15)
BUN: 11 mg/dL (ref 6–20)
CALCIUM: 9 mg/dL (ref 8.9–10.3)
CO2: 24 mmol/L (ref 22–32)
Chloride: 108 mmol/L (ref 101–111)
Creatinine, Ser: 0.94 mg/dL (ref 0.44–1.00)
GFR calc Af Amer: >60 mL/min (ref >60)
GLUCOSE: 84 mg/dL (ref 65–99)
POTASSIUM: 4.1 mmol/L (ref 3.5–5.1)
Sodium: 137 mmol/L (ref 135–145)

## 2015-02-16 NOTE — Progress Notes (Signed)
Patient discharged home with partner about 1010 with all discharge papers and prescription. Bandage changed. Small incision with sutures no active drainage/bleeding. Knee immobilizer back in place

## 2015-02-16 NOTE — Progress Notes (Signed)
Patient is very particular about her medication regimen and exactly what time to take her scheduled meds.  She has some of her meds in the room already.  Ten o'clock medication last night, pt already took her Magnesium Oxide earlier and stated that it conflicts with taking the Prograf.  I explained that the hospital will provide her medications for her so we can monitor what she is actually taking.  Patient stated "well I am going home tomorrow so that's not necessary".

## 2015-02-16 NOTE — Progress Notes (Signed)
Orthopedic Tech Progress Note Patient Details:  Candice Patton 07-Feb-1976 [id number]  Ortho Devices Type of Ortho Device: Knee Immobilizer   Saul Fordyce 02/16/2015, 9:16 AM

## 2015-02-16 NOTE — Evaluation (Signed)
Physical Therapy Evaluation Patient Details Name: Erion Hermans MRN: [id number] DOB: 08/08/1975 Today's Date: 02/16/2015   History of Present Illness  OPEN REDUCTION INTERNAL FIXATION (ORIF) LEFT LATERAL TIBIAL PLATEAU  Clinical Impression  Patient seen for initial evaluation. Following session, patient reports feeling confident with going home today. States that she does not have any questions or concerns. Discussed with patient option of having home health PT come in to check mobility and establish a home exercise program.     Follow Up Recommendations Home health PT;Supervision for mobility/OOB    Equipment Recommendations  None recommended by PT    Recommendations for Other Services       Precautions / Restrictions Precautions Precautions: None Required Braces or Orthoses: Knee Immobilizer - Left Knee Immobilizer - Left: On at all times Restrictions Weight Bearing Restrictions: Yes LLE Weight Bearing: Non weight bearing      Mobility  Bed Mobility Overal bed mobility: Needs Assistance Bed Mobility: Supine to Sit     Supine to sit: Min guard     General bed mobility comments: guard with LLE  Transfers Overall transfer level: Independent Equipment used: None             General transfer comment: sit/stand without assistive device  Ambulation/Gait Ambulation/Gait assistance: Modified independent (Device/Increase time) Ambulation Distance (Feet): 75 Feet Assistive device: Crutches Gait Pattern/deviations: Trunk flexed (swing through pattern) Gait velocity: WFL   General Gait Details: stable pattern, no loss of balance  Stairs Stairs: Yes Stairs assistance: Supervision Stair Management: No rails;Forwards;With crutches Number of Stairs: 1 General stair comments: patient reports feeling confident with stairs at home.  Wheelchair Mobility    Modified Rankin (Stroke Patients Only)       Balance Overall balance assessment: Modified Independent                                           Pertinent Vitals/Pain Pain Assessment: 0-10 Pain Score: 8  Pain Descriptors / Indicators: Sore Pain Intervention(s): Limited activity within patient's tolerance;Monitored during session    Home Living Family/patient expects to be discharged to:: Private residence Living Arrangements: Other relatives Available Help at Discharge: Family;Friend(s);Available PRN/intermittently Type of Home: House Home Access: Stairs to enter Entrance Stairs-Rails: None Entrance Stairs-Number of Steps: 1 Home Layout: One level Home Equipment: Crutches      Prior Function Level of Independence: Independent with assistive device(s) (crutches)               Hand Dominance        Extremity/Trunk Assessment               Lower Extremity Assessment: RLE deficits/detail RLE Deficits / Details: WFL       Communication   Communication: No difficulties  Cognition Arousal/Alertness: Awake/alert Behavior During Therapy: WFL for tasks assessed/performed Overall Cognitive Status: Within Functional Limits for tasks assessed                      General Comments      Exercises        Assessment/Plan    PT Assessment Patient needs continued PT services  PT Diagnosis Difficulty walking   PT Problem List Decreased strength;Decreased range of motion;Decreased activity tolerance;Decreased balance;Decreased mobility  PT Treatment Interventions DME instruction;Gait training;Stair training;Functional mobility training;Therapeutic exercise;Patient/family education   PT Goals (Current goals can be found in the Care  Plan section) Acute Rehab PT Goals Patient Stated Goal: go home  PT Goal Formulation: With patient Time For Goal Achievement: 03/02/15 Potential to Achieve Goals: Good    Frequency Min 5X/week   Barriers to discharge        Co-evaluation               End of Session Equipment Utilized During  Treatment: Gait belt;Left knee immobilizer Activity Tolerance: Patient tolerated treatment well Patient left: in bed;with call bell/phone within reach;with family/visitor present Nurse Communication: Mobility status    Functional Assessment Tool Used: clinical judgment Functional Limitation: Mobility: Walking and moving around Mobility: Walking and Moving Around Current Status (Z6109): At least 20 percent but less than 40 percent impaired, limited or restricted Mobility: Walking and Moving Around Goal Status [phone]): At least 1 percent but less than 20 percent impaired, limited or restricted    Time: 0934-0950 PT Time Calculation (min) (ACUTE ONLY): 16 min   Charges:   PT Evaluation $Initial PT Evaluation Tier I: 1 Procedure     PT G Codes:   PT G-Codes **NOT FOR INPATIENT CLASS** Functional Assessment Tool Used: clinical judgment Functional Limitation: Mobility: Walking and moving around Mobility: Walking and Moving Around Current Status (U9811): At least 20 percent but less than 40 percent impaired, limited or restricted Mobility: Walking and Moving Around Goal Status [phone]): At least 1 percent but less than 20 percent impaired, limited or restricted    Christiane Ha, PT, CSCS Pager [phone] Office [phone]  02/16/2015, 10:11 AM
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
## 2015-02-16 NOTE — Op Note (Signed)
[name], Candice Patton              ACCOUNT NO.:  [id number]  MEDICAL RECORD NO.:  [id number]  LOCATION:  [id number]                        FACILITY:  [facility]  PHYSICIAN:  Terrace Chiem C. Ophelia Charter, M.D.    DATE OF BIRTH:  1976-02-22  DATE OF PROCEDURE:  02/15/2015 DATE OF DISCHARGE:                              OPERATIVE REPORT   PREOPERATIVE DIAGNOSIS:  Lateral tibial plateau fracture, left knee.  POSTOPERATIVE DIAGNOSIS:  Lateral tibial plateau fracture, left knee.  PROCEDURE:  ORIF and screw fixation, lateral tibial plateau fracture.  SURGEON:  Dallana Mavity C. Ophelia Charter, M.D.  ANESTHESIA:  General.  TOURNIQUET:  None.  IMPLANTS:  Zimmer 6.5 mm cannulated screw with washer.  DESCRIPTION OF PROCEDURE:  After induction of general anesthesia, orotracheal intubation, the right leg was prepped from the ankle to the upper thigh with DuraPrep, usual impervious stockinette, Coban, extremity sheets and drapes were applied.  Time-out procedure was completed.  Ancef was given prophylactically.  C-arm was sterilely draped, brought in, plate was placed over the triangle.  X-ray beam was placed parallel to it and a Steinmann pin where the cannulated screw was placed over the top of the knee adjusted, and then the skin over the anterior aspect of the knee was marked with a skin marker for proper position.  Identical procedure was repeated for lateral, and then a stab incision was made laterally extended about 1 cm big enough of the washer to fit through.  C-arm was then flipped back to AP, and pin was placed parallel to the joint across the fracture site.  There were 2 fracture lines vertical and 55-mm screw was selected.  It was extended into the medial tibial plateau.  A drill was started just through the cortex and 1 cm and then 55-mm screw with washer was initially tightened down with the power drill and then hand tightened using a Freer elevator to flip the skin over the top of the washer.  It was taken down  directly onto bone and compressed.  The fracture site tightened down by hand securely, final spot pictures were taken and then the screw was appropriately secured and the guide wire was removed.  Irrigation with saline solution, 2-0 nylon simple sutures x2 was used for skin closure.  Then Xeroform, 4x4s, Webril, Ace wrap, and knee immobilizer were applied. The patient tolerated the procedure well.    Lorell Thibodaux C. Ophelia Charter, M.D.    MCY/MEDQ  D:  02/15/2015  T:  02/16/2015  Job:  [id number]

## 2015-02-16 NOTE — Progress Notes (Signed)
Subjective: 1 Day Post-Op Procedure(s) (LRB): OPEN REDUCTION INTERNAL FIXATION (ORIF) LEFT LATERAL TIBIAL PLATEAU (Left) Patient reports pain as moderate.    Objective: Vital signs in last 24 hours: Temp:  [97.8 F (36.6 C)-98.8 F (37.1 C)] 98.1 F (36.7 C) (10/04 0447) Pulse Rate:  [66-95] 66 (10/04 0447) Resp:  [12-20] 20 (10/04 0447) BP: (91-135)/(45-75) 108/63 mmHg (10/04 0447) SpO2:  [99 %-100 %] 100 % (10/04 0447) Weight:  [40.824 kg (90 lb)] 40.824 kg (90 lb) (10/03 1306)  Intake/Output from previous day: 10/03 0701 - 10/04 0700 In: 755 [I.V.:755] Out: 700 [Urine:700] Intake/Output this shift:     Recent Labs  02/15/15 1306  HGB 11.7*    Recent Labs  02/15/15 1306  WBC 8.6  RBC 4.05  HCT 36.6  PLT 213    Recent Labs  02/15/15 1306 02/16/15 0549  NA 136 137  K 4.6 4.1  CL 107 108  CO2 22 24  BUN 13 11  CREATININE 0.88 0.94  GLUCOSE 96 84  CALCIUM 9.3 9.0    Recent Labs  02/15/15 1306  INR 1.01    Neurologically intact  Assessment/Plan: 1 Day Post-Op Procedure(s) (LRB): OPEN REDUCTION INTERNAL FIXATION (ORIF) LEFT LATERAL TIBIAL PLATEAU (Left) Up with therapy, discharge home today. Office one week  Mardie Kellen C 02/16/2015, 7:28 AM

## 2015-02-17 ENCOUNTER — Encounter (HOSPITAL_COMMUNITY): Payer: Self-pay | Admitting: Orthopaedic Surgery
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
## 2015-04-16 ENCOUNTER — Ambulatory Visit (HOSPITAL_COMMUNITY): Payer: Medicare Other

## 2015-04-16 ENCOUNTER — Other Ambulatory Visit (HOSPITAL_COMMUNITY): Payer: Medicare Other
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
## 2016-09-28 IMAGING — RF DG C-ARM 61-120 MIN
1 series · 2 of 2 positions shown · non-contrast
Comparison: 02/08/2015

CLINICAL DATA: Lateral tibial plateau fracture

EXAM:
DG C-ARM 61-120 MIN; LEFT KNEE - 3 VIEW

[Series 1: run · 2 of 2 slices shown]
[im 1/2]
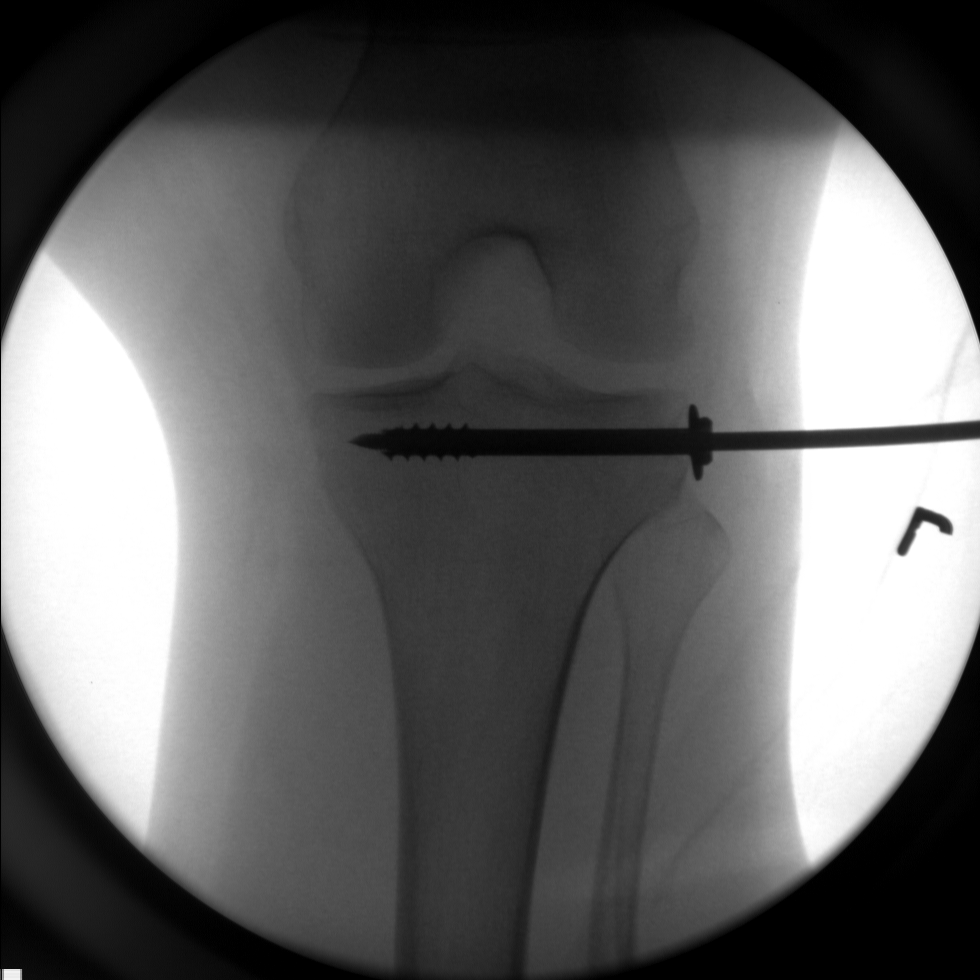
[im 2/2]
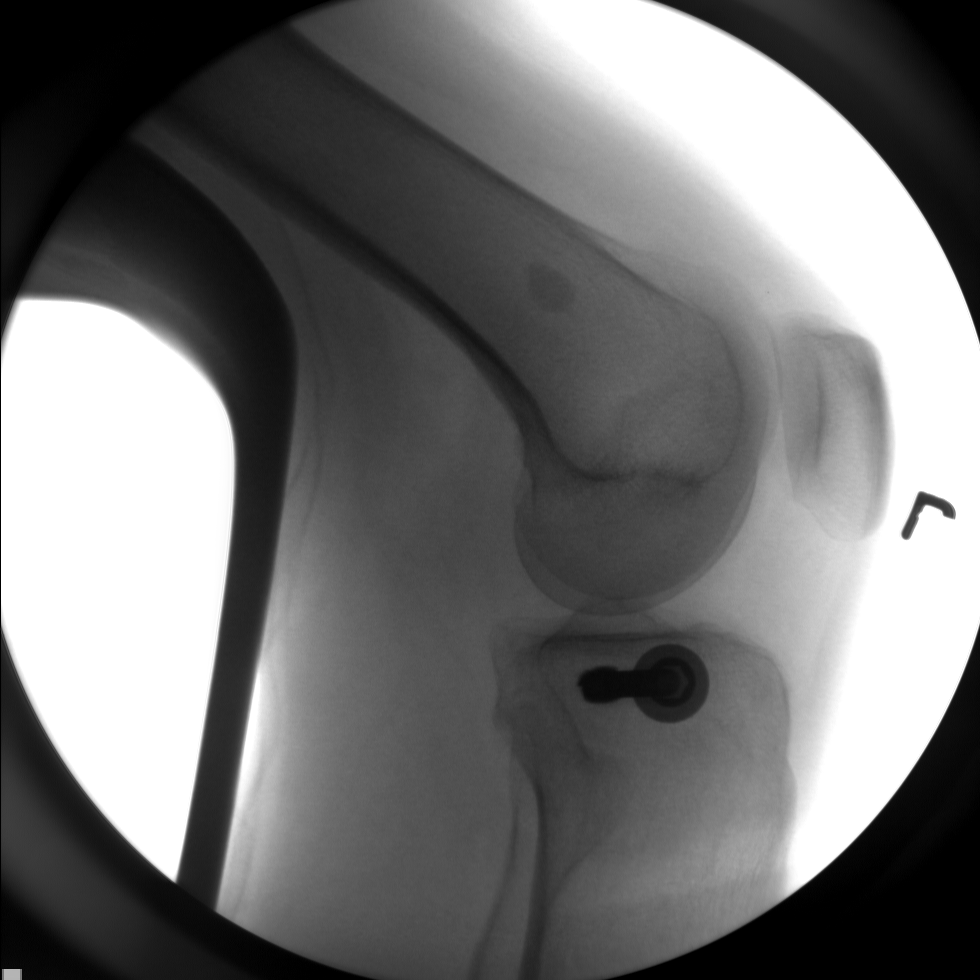

[2 of 2 positions shown; findings below may reference images not displayed]

FLUOROSCOPY TIME:  Radiation Exposure Index (as provided by the
fluoroscopic device): Not available

If the device does not provide the exposure index:

Fluoroscopy Time:  11 seconds

Number of Acquired Images:  2
FINDINGS: A single fixation screw is noted traversing the lateral tibial
plateau. The fracture fragments are in near anatomic alignment.
IMPRESSION: Status post ORIF of lateral tibial plateau fracture
